# Patient Record
Sex: Male | Born: 1975 | Race: White | Hispanic: No | Marital: Married
Health system: Southern US, Community
[De-identification: ages and names within clinical notes are randomized; demographics above are authoritative.]

---

## 2012-02-02 ENCOUNTER — Emergency Department: Payer: Self-pay | Admitting: Unknown Physician Specialty

## 2012-02-02 LAB — URINALYSIS, COMPLETE
Bacteria: NONE SEEN
Bilirubin,UR: NEGATIVE
Glucose,UR: NEGATIVE mg/dL (ref 0–75)
Ph: 5 (ref 4.5–8.0)
Squamous Epithelial: <1
WBC UR: NONE SEEN /HPF (ref 0–5)

## 2012-02-02 LAB — CK TOTAL AND CKMB (NOT AT ARMC)
CK, Total: 183 U/L (ref 35–232)
CK-MB: 0.8 ng/mL (ref 0.5–3.6)

## 2012-02-02 LAB — COMPREHENSIVE METABOLIC PANEL
Albumin: 3.9 g/dL (ref 3.4–5.0)
Alkaline Phosphatase: 158 U/L — ABNORMAL HIGH (ref 50–136)
BUN: 6 mg/dL — ABNORMAL LOW (ref 7–18)
Bilirubin,Total: 1.2 mg/dL — ABNORMAL HIGH (ref 0.2–1.0)
Chloride: 94 mmol/L — ABNORMAL LOW (ref 98–107)
Co2: 27 mmol/L (ref 21–32)
Creatinine: 0.61 mg/dL (ref 0.60–1.30)
EGFR (Non-African Amer.): >60
SGPT (ALT): 246 U/L — ABNORMAL HIGH
Sodium: 135 mmol/L — ABNORMAL LOW (ref 136–145)
Total Protein: 8 g/dL (ref 6.4–8.2)

## 2012-02-02 LAB — DRUG SCREEN, URINE
Amphetamines, Ur Screen: NEGATIVE (ref ?–1000)
Benzodiazepine, Ur Scrn: NEGATIVE (ref ?–200)
Cannabinoid 50 Ng, Ur ~~LOC~~: NEGATIVE (ref ?–50)
Cocaine Metabolite,Ur ~~LOC~~: NEGATIVE (ref ?–300)
MDMA (Ecstasy)Ur Screen: NEGATIVE (ref ?–500)
Methadone, Ur Screen: NEGATIVE (ref ?–300)
Phencyclidine (PCP) Ur S: NEGATIVE (ref ?–25)
Tricyclic, Ur Screen: NEGATIVE (ref ?–1000)

## 2012-02-02 LAB — CBC
HGB: 15.3 g/dL (ref 13.0–18.0)
MCV: 89 fL (ref 80–100)
Platelet: 117 10*3/uL — ABNORMAL LOW (ref 150–440)
RBC: 4.88 10*6/uL (ref 4.40–5.90)
WBC: 5.6 10*3/uL (ref 3.8–10.6)

## 2012-02-02 LAB — ETHANOL
Ethanol %: 0.021 % (ref 0.000–0.080)
Ethanol: 21 mg/dL

## 2012-02-02 LAB — MAGNESIUM: Magnesium: 1.6 mg/dL — ABNORMAL LOW

## 2012-02-02 LAB — TROPONIN I: Troponin-I: <0.02 ng/mL

## 2012-05-12 ENCOUNTER — Emergency Department: Payer: Self-pay | Admitting: Emergency Medicine

## 2012-05-12 LAB — COMPREHENSIVE METABOLIC PANEL
Albumin: 4 g/dL (ref 3.4–5.0)
Alkaline Phosphatase: 125 U/L (ref 50–136)
Anion Gap: 17 — ABNORMAL HIGH (ref 7–16)
BUN: 7 mg/dL (ref 7–18)
Bilirubin,Total: 0.8 mg/dL (ref 0.2–1.0)
Calcium, Total: 7.7 mg/dL — ABNORMAL LOW (ref 8.5–10.1)
Chloride: 92 mmol/L — ABNORMAL LOW (ref 98–107)
Creatinine: 0.62 mg/dL (ref 0.60–1.30)
EGFR (Non-African Amer.): >60
Glucose: 116 mg/dL — ABNORMAL HIGH (ref 65–99)
Osmolality: 254 (ref 275–301)
Sodium: 127 mmol/L — ABNORMAL LOW (ref 136–145)
Total Protein: 8.1 g/dL (ref 6.4–8.2)

## 2012-05-12 LAB — BASIC METABOLIC PANEL
Anion Gap: 14 (ref 7–16)
Calcium, Total: 7.4 mg/dL — ABNORMAL LOW (ref 8.5–10.1)
Chloride: 103 mmol/L (ref 98–107)
Co2: 21 mmol/L (ref 21–32)
EGFR (African American): >60
EGFR (Non-African Amer.): >60
Potassium: 3.5 mmol/L (ref 3.5–5.1)

## 2012-05-12 LAB — ETHANOL
Ethanol %: 0.206 % — ABNORMAL HIGH (ref 0.000–0.080)
Ethanol: 206 mg/dL

## 2012-05-12 LAB — DRUG SCREEN, URINE
Amphetamines, Ur Screen: NEGATIVE (ref ?–1000)
Barbiturates, Ur Screen: NEGATIVE (ref ?–200)
Cannabinoid 50 Ng, Ur ~~LOC~~: NEGATIVE (ref ?–50)
Cocaine Metabolite,Ur ~~LOC~~: NEGATIVE (ref ?–300)
MDMA (Ecstasy)Ur Screen: NEGATIVE (ref ?–500)
Methadone, Ur Screen: NEGATIVE (ref ?–300)
Phencyclidine (PCP) Ur S: NEGATIVE (ref ?–25)

## 2012-05-12 LAB — CBC
HCT: 48.4 % (ref 40.0–52.0)
HGB: 17.1 g/dL (ref 13.0–18.0)
Platelet: 152 10*3/uL (ref 150–440)
RBC: 5.07 10*6/uL (ref 4.40–5.90)
WBC: 6.9 10*3/uL (ref 3.8–10.6)

## 2012-05-12 LAB — TSH: Thyroid Stimulating Horm: 1.88 u[IU]/mL

## 2012-05-12 LAB — ACETAMINOPHEN LEVEL: Acetaminophen: <2 ug/mL

## 2012-05-12 LAB — SALICYLATE LEVEL: Salicylates, Serum: 2.2 mg/dL

## 2012-05-16 ENCOUNTER — Ambulatory Visit: Payer: Self-pay | Admitting: Internal Medicine

## 2012-09-09 LAB — URINALYSIS, COMPLETE
Bilirubin,UR: NEGATIVE
Blood: NEGATIVE
Ketone: NEGATIVE
Leukocyte Esterase: NEGATIVE
Ph: 5 (ref 4.5–8.0)
Protein: NEGATIVE
RBC,UR: NONE SEEN /HPF (ref 0–5)
Squamous Epithelial: <1

## 2012-09-09 LAB — DRUG SCREEN, URINE
Cocaine Metabolite,Ur ~~LOC~~: NEGATIVE (ref ?–300)
MDMA (Ecstasy)Ur Screen: NEGATIVE (ref ?–500)
Methadone, Ur Screen: NEGATIVE (ref ?–300)
Opiate, Ur Screen: NEGATIVE (ref ?–300)
Tricyclic, Ur Screen: NEGATIVE (ref ?–1000)

## 2012-09-09 LAB — COMPREHENSIVE METABOLIC PANEL
Albumin: 4.1 g/dL (ref 3.4–5.0)
Anion Gap: 20 — ABNORMAL HIGH (ref 7–16)
Bilirubin,Total: 1.9 mg/dL — ABNORMAL HIGH (ref 0.2–1.0)
Calcium, Total: 8.1 mg/dL — ABNORMAL LOW (ref 8.5–10.1)
Chloride: 91 mmol/L — ABNORMAL LOW (ref 98–107)
Creatinine: 0.6 mg/dL (ref 0.60–1.30)
EGFR (African American): >60
Glucose: 139 mg/dL — ABNORMAL HIGH (ref 65–99)
Potassium: 3.7 mmol/L (ref 3.5–5.1)
SGOT(AST): 1766 U/L — ABNORMAL HIGH (ref 15–37)
SGPT (ALT): 1330 U/L — ABNORMAL HIGH (ref 12–78)

## 2012-09-09 LAB — CBC
HCT: 46.1 % (ref 40.0–52.0)
MCH: 32.2 pg (ref 26.0–34.0)
MCHC: 36 g/dL (ref 32.0–36.0)
MCV: 89 fL (ref 80–100)

## 2012-09-09 LAB — TSH: Thyroid Stimulating Horm: 3.49 u[IU]/mL

## 2012-09-09 LAB — ETHANOL: Ethanol %: 0.285 % — ABNORMAL HIGH (ref 0.000–0.080)

## 2012-09-10 ENCOUNTER — Inpatient Hospital Stay: Payer: Self-pay | Admitting: Internal Medicine

## 2012-09-10 LAB — HEPATIC FUNCTION PANEL A (ARMC)
Albumin: 4.2 g/dL (ref 3.4–5.0)
Alkaline Phosphatase: 152 U/L — ABNORMAL HIGH (ref 50–136)
Bilirubin,Total: 1.9 mg/dL — ABNORMAL HIGH (ref 0.2–1.0)
SGOT(AST): 1841 U/L — ABNORMAL HIGH (ref 15–37)

## 2012-09-10 LAB — PROTIME-INR
INR: 1.6
Prothrombin Time: 19.8 secs — ABNORMAL HIGH (ref 11.5–14.7)

## 2012-09-10 LAB — BASIC METABOLIC PANEL
Anion Gap: 20 — ABNORMAL HIGH (ref 7–16)
BUN: 9 mg/dL (ref 7–18)
Calcium, Total: 8.4 mg/dL — ABNORMAL LOW (ref 8.5–10.1)
Chloride: 95 mmol/L — ABNORMAL LOW (ref 98–107)
Co2: 20 mmol/L — ABNORMAL LOW (ref 21–32)
EGFR (African American): >60
Glucose: 186 mg/dL — ABNORMAL HIGH (ref 65–99)
Osmolality: 274 (ref 275–301)
Sodium: 135 mmol/L — ABNORMAL LOW (ref 136–145)

## 2012-09-10 LAB — MAGNESIUM: Magnesium: 1.7 mg/dL — ABNORMAL LOW

## 2012-09-10 LAB — SALICYLATE LEVEL: Salicylates, Serum: <1.7 mg/dL

## 2012-09-10 LAB — HEMOGLOBIN A1C: Hemoglobin A1C: 5.1 % (ref 4.2–6.3)

## 2012-09-10 LAB — OSMOLALITY: Osmolality: 346 mOsm/kg (ref 275–295)

## 2012-09-10 LAB — LIPASE, BLOOD: Lipase: 144 U/L (ref 73–393)

## 2012-09-11 LAB — COMPREHENSIVE METABOLIC PANEL
Alkaline Phosphatase: 118 U/L (ref 50–136)
Anion Gap: 10 (ref 7–16)
BUN: 7 mg/dL (ref 7–18)
Bilirubin,Total: 2.2 mg/dL — ABNORMAL HIGH (ref 0.2–1.0)
Calcium, Total: 7.8 mg/dL — ABNORMAL LOW (ref 8.5–10.1)
Chloride: 101 mmol/L (ref 98–107)
Co2: 26 mmol/L (ref 21–32)
Creatinine: 0.61 mg/dL (ref 0.60–1.30)
EGFR (African American): >60
EGFR (Non-African Amer.): >60
Osmolality: 272 (ref 275–301)
Potassium: 3.9 mmol/L (ref 3.5–5.1)
SGPT (ALT): 940 U/L — ABNORMAL HIGH (ref 12–78)

## 2012-09-11 LAB — CBC WITH DIFFERENTIAL/PLATELET
Basophil %: 0.8 %
Eosinophil #: 0.1 10*3/uL (ref 0.0–0.7)
HCT: 40.8 % (ref 40.0–52.0)
HGB: 14.1 g/dL (ref 13.0–18.0)
Lymphocyte %: 41 %
MCHC: 34.6 g/dL (ref 32.0–36.0)
MCV: 91 fL (ref 80–100)
Monocyte %: 7.4 %
Neutrophil #: 2.5 10*3/uL (ref 1.4–6.5)
Neutrophil %: 48.6 %
RDW: 15.1 % — ABNORMAL HIGH (ref 11.5–14.5)

## 2012-09-12 LAB — HEPATIC FUNCTION PANEL A (ARMC)
Albumin: 3.6 g/dL (ref 3.4–5.0)
Alkaline Phosphatase: 121 U/L (ref 50–136)
Bilirubin, Direct: 0.5 mg/dL — ABNORMAL HIGH (ref 0.00–0.20)
Bilirubin,Total: 1.8 mg/dL — ABNORMAL HIGH (ref 0.2–1.0)
SGOT(AST): 482 U/L — ABNORMAL HIGH (ref 15–37)
SGPT (ALT): 698 U/L — ABNORMAL HIGH (ref 12–78)

## 2012-09-16 ENCOUNTER — Ambulatory Visit: Payer: Self-pay | Admitting: Unknown Physician Specialty

## 2012-09-23 ENCOUNTER — Ambulatory Visit: Payer: Self-pay | Admitting: Unknown Physician Specialty

## 2012-09-26 IMAGING — CT CT CHEST W/ CM
1 series · 1 of 1 positions shown, 3 images · non-contrast
Comparison: none

REASON FOR EXAM: cp sob htn crisis r/o dissection
COMMENTS:   May transport without cardiac monitor

[Series 1005: ct_routine_abdomen_genericreadingtask result image · 0.80mm/px · 1 of 1 slices shown, 3 images]
[im 1/1  soft-tissue]
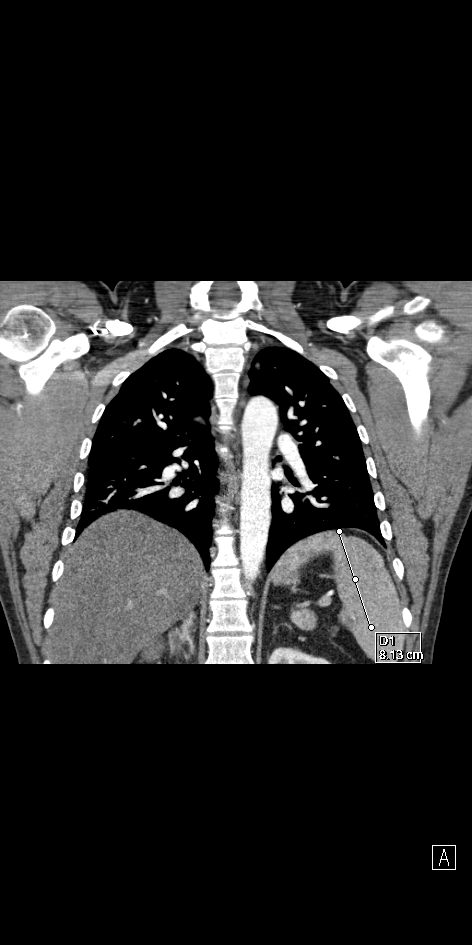
[im 1/1  lung]
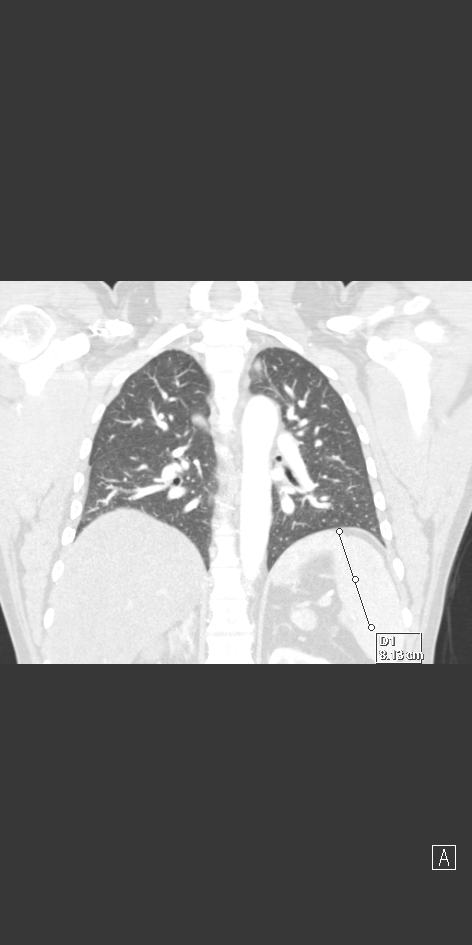
[im 1/1  bone]
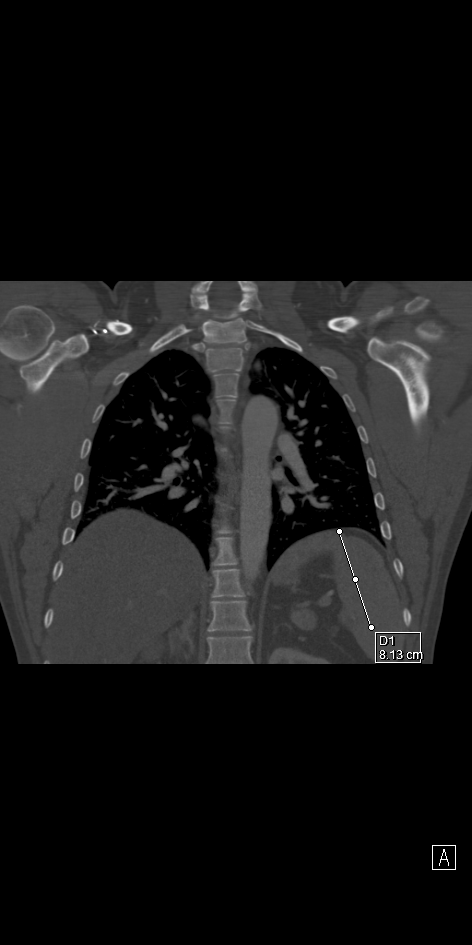

[1 of 1 positions shown; findings below may reference images not displayed]

PROCEDURE:     CT  - CT CHEST WITH CONTRAST  - February 02, 2012 [DATE]

RESULT:     CT scanning was performed through the chest with reconstructions
in the axial plane at 3 mm intervals and slice thicknesses following
intravenous administration of 100 cc of [id number]. Review of multiplanar
reconstructed images was performed separately on the VIA monitor.

The caliber of the thoracic aorta is normal. There is no evidence of a false
lumen. There is no periaortic hematoma. The cardiac chambers are normal in
size. Contrast within the visualized portions of the pulmonary arterial tree
is normal. I see no mediastinal nor hilar lymphadenopathy. There is no
pleural nor pericardial effusion.

At lung window settings I see no interstitial nor alveolar infiltrates. No
pulmonary parenchymal nodules are demonstrated. The thoracic vertebral
bodies are preserved in height. Within the upper abdomen the liver
demonstrates decreased density diffusely consistent with fatty infiltration.
The spleen is mildly enlarged measuring 14 cm in oblique AP dimension. There
is accessory spleen measuring 1.5 cm in diameter. The adrenal glands appear
normal where visualized but are not completely included in the field-of-view.
IMPRESSION: 1. There is no evidence of thoracic aortic dissection.
2. I see no acute pulmonary embolism. There is no evidence of CHF.
3. There is no evidence of pneumonia nor pneumothorax nor pneumomediastinum.
4. There are fatty infiltrative changes diffusely throughout the liver.

## 2012-10-24 ENCOUNTER — Ambulatory Visit: Payer: Self-pay | Admitting: Unknown Physician Specialty

## 2013-05-05 IMAGING — US ABDOMEN ULTRASOUND
1 series · 14 of 25 positions shown · non-contrast
Comparison: none

REASON FOR EXAM: alcoholic hepatitis
COMMENTS:

PROCEDURE:     US  - US ABDOMEN GENERAL SURVEY  - September 10, 2012 [DATE]
RESULT:     Comparison: None.
TECHNIQUE: Multiple grayscale and color Doppler images were obtained of the
abdomen.

[Series 1: abdomen ultrasound · 0.31mm/px · 14 of 70 slices shown]
[im 1/70]
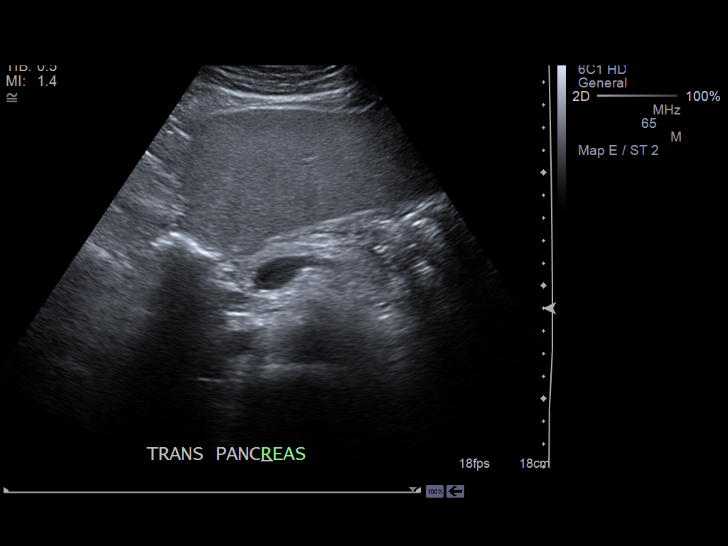
[im 6/70]
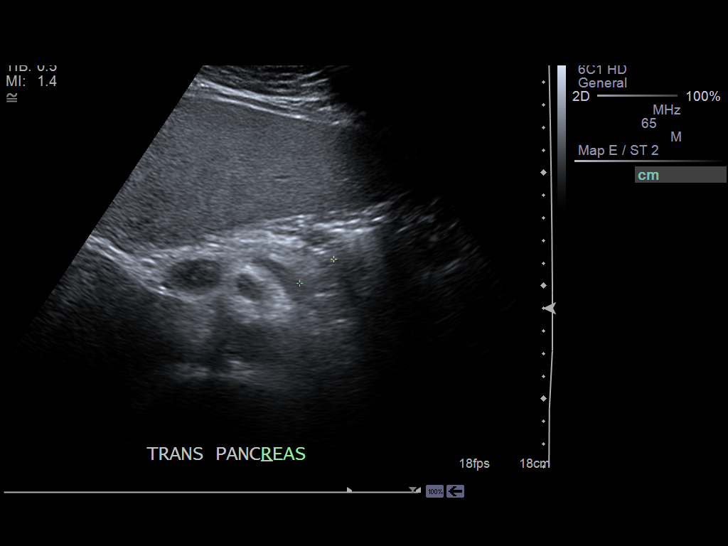
[im 12/70]
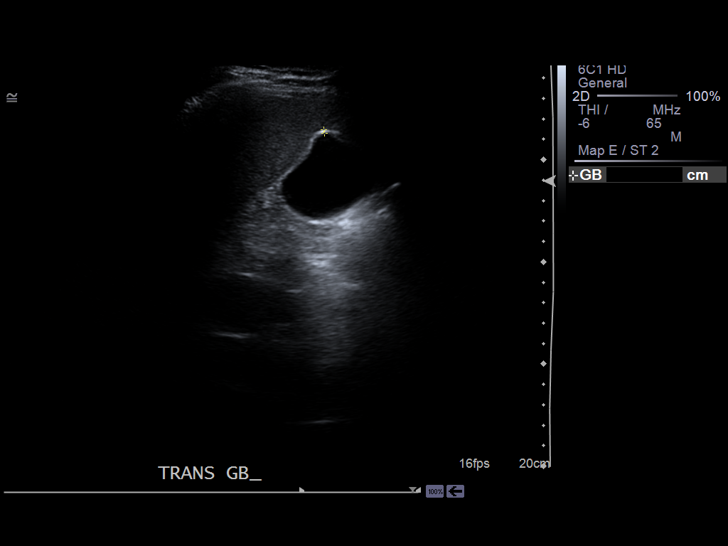
[im 18/70]
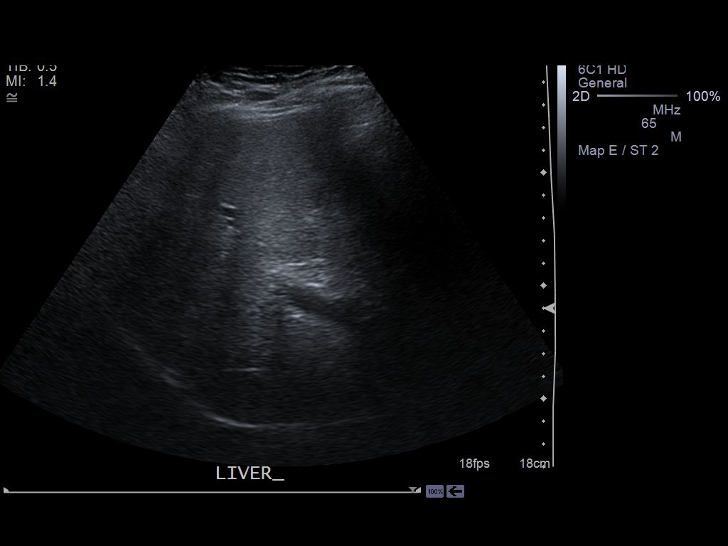
[im 24/70]
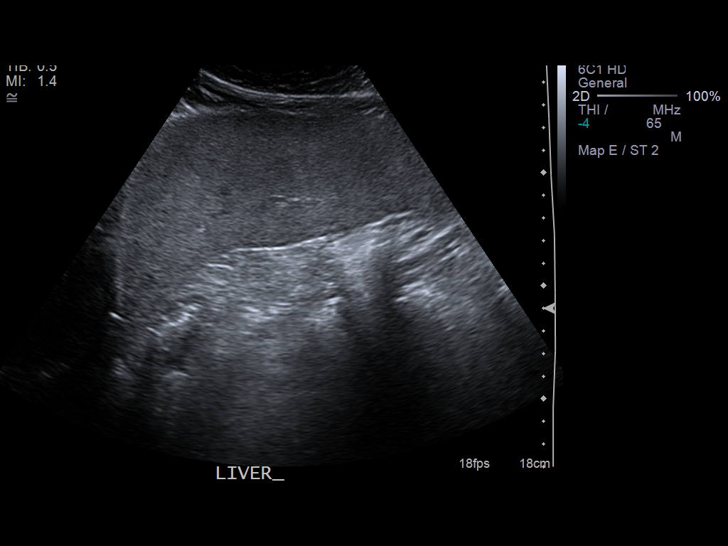
[im 26/70]
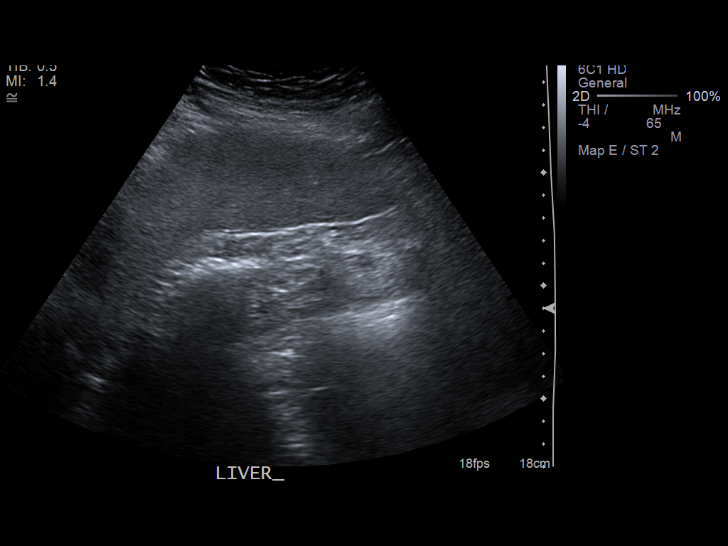
[im 32/70]
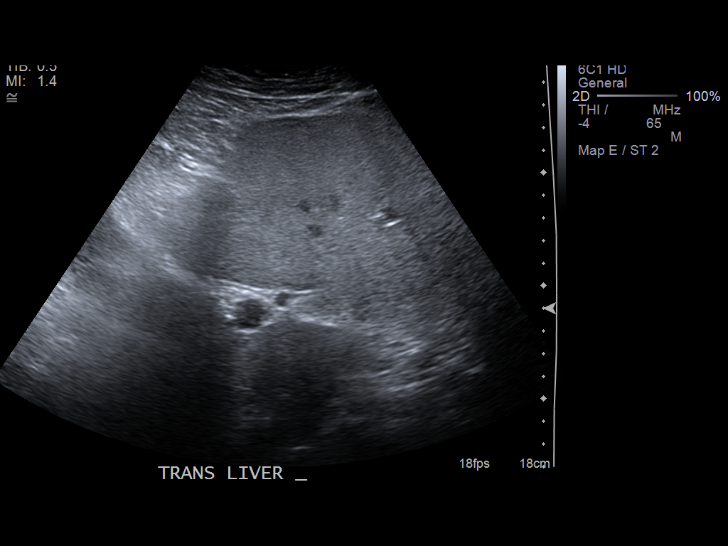
[im 38/70]
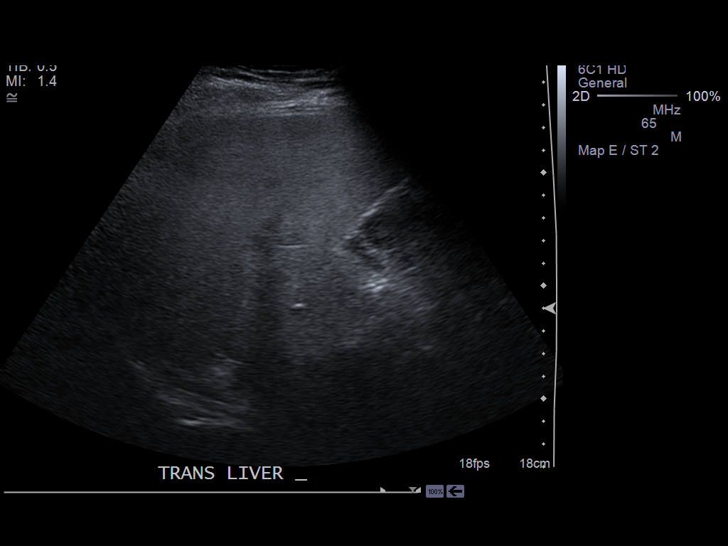
[im 44/70]
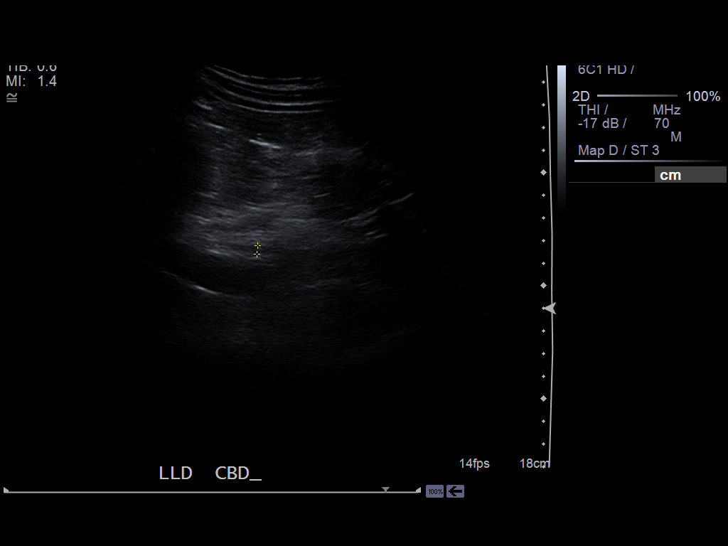
[im 47/70]
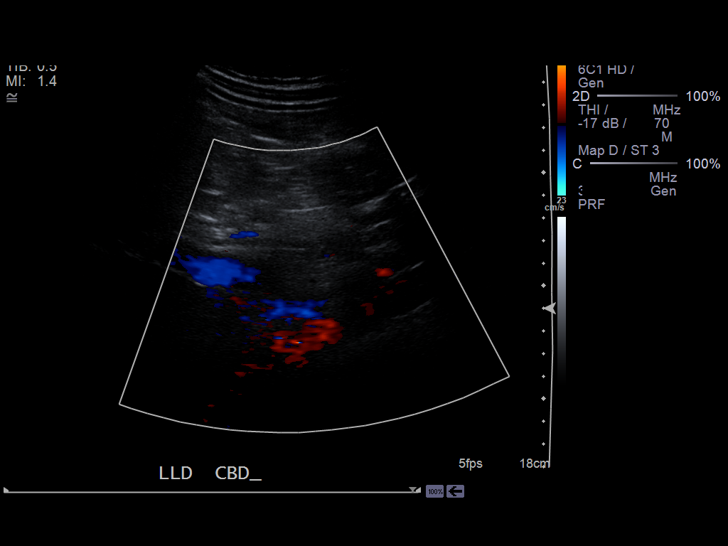
[im 52/70]
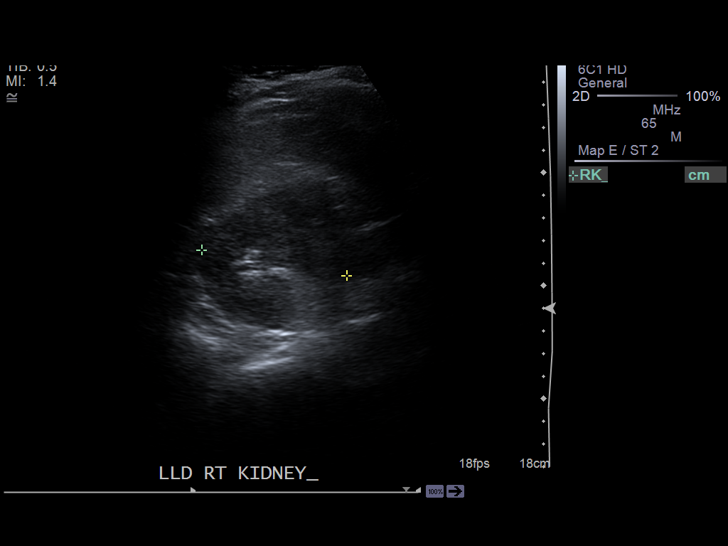
[im 58/70]
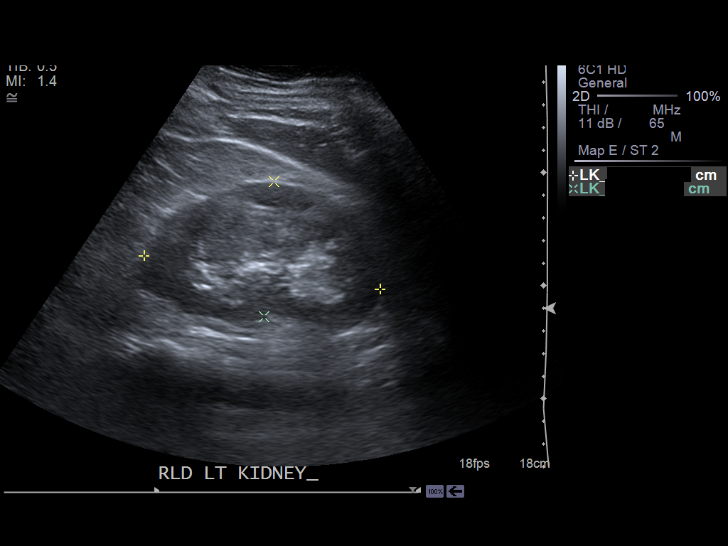
[im 64/70]
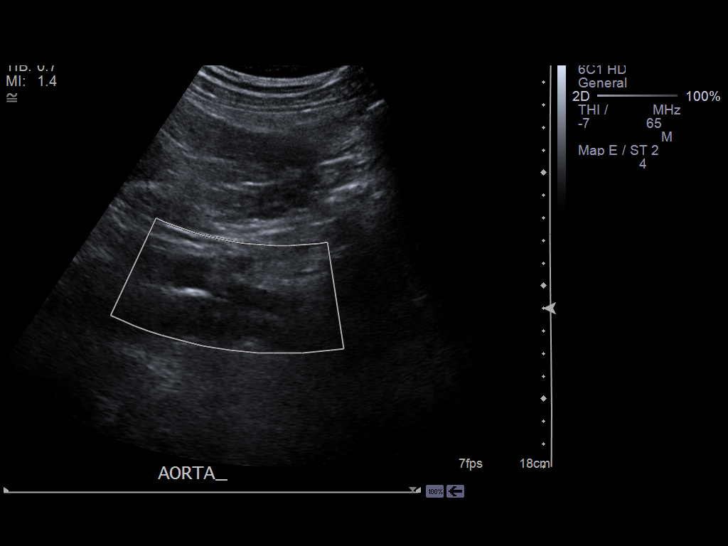
[im 70/70]
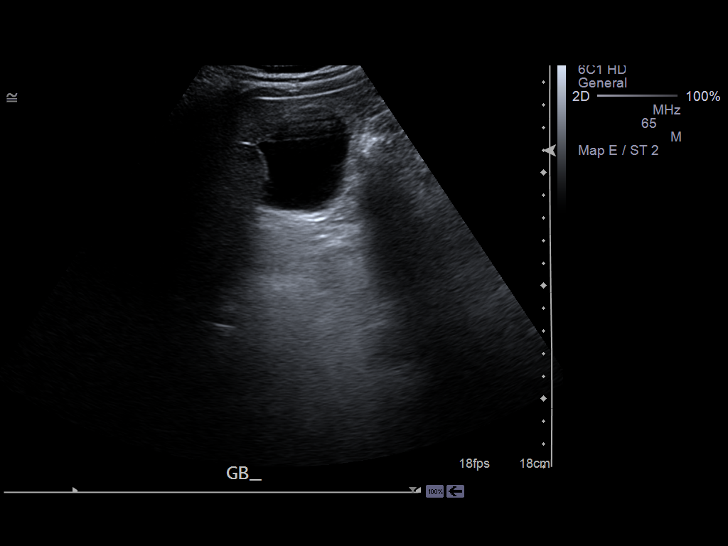

[14 of 25 positions shown; findings below may reference images not displayed]

FINDINGS: The left hepatic lobe is somewhat prominent in size. The liver is somewhat
heterogeneous in echotexture. The portal vein demonstrates normal
directional flow. The visualized pancreas and spleen are unremarkable. No
gallstones are seen. There are several folds in the gallbladder. The
sonographic Murphy sign was negative. The common bile duct measures 4 mm.

Image of the kidneys showed no hydronephrosis.
IMPRESSION: No acute findings. The liver is somewhat heterogeneous in echotexture and
the left hepatic lobe is somewhat prominent in size. This is nonspecific.

[REDACTED]

## 2015-04-12 NOTE — Consult Note (Signed)
PATIENT NAME:  [name] BertinWOODY, [name] E MR#:  [id number] DATE OF BIRTH:  [birth year]  DATE OF CONSULTATION:  09/10/2012  REFERRING PHYSICIAN:  [facility]  CONSULTING PHYSICIAN:  Lurline DelShaukat Shravya Wickwire, MD  REASON FOR CONSULTATION: Probable alcoholic hepatitis.   HISTORY OF PRESENT ILLNESS: This is a 39 year old Caucasian male with history of chronic alcohol abuse who presented for detox. The patient was noted to have very abnormal liver enzymes. The liver enzymes are much more higher than in the past. Apparently he has been drinking very heavily within the last week or so and then decided to come to the hospital for detox. He denies any abdominal pain, denies any nausea or vomiting, denies any diarrhea or constipation, and denies using any Tylenol or any other illicit drugs.   PAST MEDICAL HISTORY:  1. 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  2. History of hypernatremia in the past. 3. Hypertension. 4. Depression. 5. Chronic alcohol abuse. 6. Fatty liver and mild splenomegaly with accessory spleen, according to the chart.   PAST SURGICAL HISTORY: None.   ALLERGIES: No known drug allergies.   HOME MEDICATIONS: Citalopram, omeprazole, Ativan, and metoprolol.   SOCIAL HISTORY: He drinks up to 24 drinks a day.   FAMILY HISTORY: Unremarkable.   REVIEW OF SYSTEMS: Grossly negative except for what is mentioned in the History of Present Illness.     PHYSICAL EXAMINATION:   GENERAL: Fairly well built male. He does appear to be somewhat shaky and tremulous.  VITAL SIGNS: Temperature 97.8, pulse 85, respirations 18 to 20, and blood pressure 144/90. Clinically he is not jaundiced.   NECK: Neck veins are flat.   LUNGS: Clear to auscultation bilaterally with fair air entry and no added sounds.   CARDIOVASCULAR: Regular rate and rhythm. No gallops or murmurs.   ABDOMEN: Abdominal examination is fairly benign. No significant hepatosplenomegaly was noted. No ascites.   EXTREMITIES: No edema.    NEUROLOGIC: Negative except for mild bilateral upper extremity tremors.   RESULTS: BUN and creatinine are normal. Serum sodium is low at 128. Total bilirubin is 1.9, alkaline phosphatase 137, ALT 1330, and AST 1766. Serum alcohol level is 285. TSH is normal. Drug screen is negative. Serum lipase is normal at 144. INR is slightly elevated at 1.6. White cell count is 10.7, hemoglobin 16, and platelet count 259.  Ultrasound of the abdomen showed no acute finding, somewhat heterogenous echotexture of the liver with prominent left hepatic lobe.   Serum Tylenol and salicylate levels are normal.   ASSESSMENT AND PLAN: The patient is with what appears to be acute alcoholic hepatitis. Hepatic function seems to be fairly well preserved, although he does have mild hyperbilirubinemia and mild coagulopathy. I will go ahead and obtain an acute hepatitis panel to rule out other causes of acute hepatitis. Repeat LFTs every day, repeat PT and INR on a daily basis. Currently the patient is not showing any signs of encephalopathy. In case there are signs of encephalopathy, I would recommend checking his ammonia and starting him on lactulose. The     patient is at high risk of going into DTs in which case I would recommend minimizing the use of Ativan and other sedatives, although they can certainly be used if necessary. We will follow and make further recommendations.  ____________________________ Lurline DelShaukat Virgie Chery, MD si:slb D: 09/10/2012 19:28:15 ET T: 09/11/2012 12:31:31 ET JOB#: [id number]  cc: Lurline DelShaukat Jalena Vanderlinden, MD, <Dictator> Lurline DelSHAUKAT Gema Ringold MD ELECTRONICALLY SIGNED 09/15/2012 12:36

## 2015-04-12 NOTE — Discharge Summary (Signed)
PATIENT NAME:  [name] Porter, [name] MR#:  [id number] DATE OF BIRTH:  1976/03/21  DATE OF ADMISSION:  09/10/2012 DATE OF DISCHARGE:  09/12/2012  ADMITTING DIAGNOSES: Nausea and vomiting.  DISCHARGE DIAGNOSES:  1. Nausea and vomiting due to acute alcoholic hepatitis, now his hepatitis is improving. 2. Acute alcoholic hepatitis, LFTs trending downwards. The patient was seen by gastroenterology and initially started on prednisone due to discrimination function of 38. He also had a hepatitis panel which was negative. The patient is recommended to stop drinking.  3. Alcohol abuse. He was seen by psychiatry. He is offered a program for outpatient therapy for detox. The patient is deciding on that.  4. Anion gap metabolic acidosis, resolved with IV hydration.  5. Mild alcohol withdrawal, improved with current treatment. The patient was offered benzodiazepines at discharge. He states that he has 1-1/2 weeks worth from previous hospitalization.  6. Hyponatremia felt to be due to dehydration, possibly beer potomania, resolved with IV fluids.  7. Hypomagnesemia, replaced. 8. History of reflux.  9. Hypertension with accelerated blood pressure intermittently during hospitalization related to withdrawal.  10. History of depression and anxiety.  CONSULTANTS DURING HOSPITALIZATION:  1. Lurline DelShaukat Iftikhar, MD 2. Greer EeWilliam Ryan, MD  PERTINENT LABS AND EVALUATIONS: BMP: Glucose 139, BUN 8, creatinine 0.60, sodium 128, potassium 3.7, chloride 91, CO2 17, and calcium 8.1. Magnesium 1.7 and on 09/10/2012 magnesium was 2.2. Sodium 137. LFTs on presentation: Total protein 8.1, albumin 4.1, bilirubin total 1.9, alkaline phosphatase 137, AST 1766, and ALT 1330. Subsequent LFTs on 09/12/2012 showed a bilirubin total of 1.8. Today AST is 482 and ALT 698. TSH was 3.49. TUDs were negative. INR 1.3. Hepatitis A, B, and C were negative. Acetaminophen level less than 2.    Ultrasound of the abdomen showed no acute findings. Liver was  somewhat heterogeneous in echotexture. Left hepatic lobe was somewhat prominent in size.   HOSPITAL COURSE: Please refer to the history and physical done by the admitting physician. The patient is a 39 year old Caucasian male with history of chronic alcohol abuse who presented with the chief complaint of wanting detox stating he wanted to get help because he wanted to stop drinking. The patient prior to arriving was drinking heavily for the past 1 to 2 weeks, drinking about 12 drinks daily. He started having also nausea and vomiting. He came to the ED and was noted to have acute alcoholic hepatitis.  He also had anion gap metabolic acidosis as well as multiple other metabolic derangements. The patient was admitted and was started on IV fluids. He was also started on prednisone due to his LFTs being significantly elevated. He was seen in consultation by gastroenterology as well. As the hospitalization progressed, his LFTs started trending downwards and if the patient does not drink as expected then his LFTs will return back close to his baseline. He also was seen by psychiatry. They recommended CD IOP after discharge. The patient is to decide on this after discussion with his wife. The patient was strongly discouraged to drink. He was also offered benzos for withdrawal. He reports that he has Ativan from previously that he was prescribed. At this time, he is stable and stable for discharge.   DISCHARGE MEDICATIONS:  1. Metoprolol extended-release 25 mg daily.  2. Omeprazole 40 mg daily.  3. Citalopram 40 mg daily.  4. Folic acid 1 mg daily.  5. Multivitamin p.o. daily.   DIET: Regular.   ACTIVITY: As tolerated.  DISCHARGE FOLLOWUP: Followup with [facility] in 1 to 2 weeks. Followup with CD IOP as per psychiatry.  TIME SPENT: 32 minutes. ____________________________ [name] Porter [name] Katz, MD shp:slb D: 09/12/2012 14:16:56 ET T: 09/15/2012 12:06:47 ET JOB#: [id number]  cc: Lania Zawistowski  H. [name] Katz, MD, <Dictator> [facility] [place] Charise Carwin MD ELECTRONICALLY SIGNED 09/16/2012 15:00

## 2015-04-12 NOTE — H&P (Signed)
PATIENT NAME:  Porter Porter MR#:  [id number] DATE OF BIRTH:  January 13, 1976  DATE OF ADMISSION:  09/10/2012  PRIMARY CARE PHYSICIAN:  [facility], [place]. He says that he has been there recently and he was seen by Doristine Mango.   CHIEF COMPLAINT: Alcohol detox, nausea and vomiting.   HISTORY OF PRESENT ILLNESS: The patient is a 39 year old Caucasian male with history of chronic alcohol abuse presented with chief complaint of wanting detox stating, "I needed to get help because of changes I saw in myself". Upon probing he reports that he has been drinking heavily over the last 1 to 2 weeks, drinking about 12 drinks daily and two days ago developed nausea, vomiting and just overall feeling sick and decided that he needed to get help. He denies abdominal pain, fevers or chills, flulike symptoms or lymphadenopathy. He does note some fatigue.   The patient notes that has a history of chronic alcohol abuse and required detox back in May. At that time he was drinking about 24 drinks per day, if not more, daily. He was admitted for alcohol detox at that time and he said that since May he has relapsed about 3 times with intermittent, basically monthly binges of two weeks of heavy drinking. He notes that he was having about a drink per day, but then two weeks ago increased his quantity to about 12 drinks per day and when asked what life event prompted this behavior, he just says that sometimes he remembers that his sister overdosed from drugs "six months ago" and passed away in [facility]. Of note, he said the exact same thing when he was evaluated here back in May 2013.   He denies history of alcohol withdrawal seizures, but notes that when he was "drinking heavily back in May" and after stopping alcohol, within 1 to 2 days he would get shakes.   Upon evaluation in the Emergency Department he was noted to have severe hepatitis with AST of 1,766 and ALT 1,330 which is markedly increased from prior  laboratory data that has been available. On his last evaluation in May he had transaminitis, AST was 272, ALT 145 and in February his AST was 415 and his ALT was 246.   The patient denies taking Tylenol, aspirin or drinking other non-ethanol products like antifreeze, etc.   PAST MEDICAL HISTORY:  1. Hepatitis, most likely alcoholic hepatitis.  2. Transient hyponatremia noted on labs in the past.  3. Hypertension.  4. Depression, anxiety.  5. Chronic alcohol abuse.  6. Fatty liver.  7. Mild splenomegaly with accessory spleen.   PAST SURGICAL HISTORY: None.   ALLERGIES: No known drug allergies, admits to seasonal allergies.   MEDICATIONS:  1. Metoprolol 25 mg extended release 1 tablet daily. It looks like last fill date was in May.  2. Citalopram 10 mg daily.  3. Omeprazole 40 mg daily.  4. Ativan (He notes this was given to him the last time he was going through detox. Review of our records notes that he was on Ativan 1 mg twice a day. The patient states the last dose of Ativan was four days ago).   SOCIAL HISTORY: Daily alcohol intake, currently 12 drinks per day and has had up to 24 drinks or greater per day. He denies tobacco. He quit 15 years ago. Denies illicits. He is married with two children, a 46 year old daughter and a 6-year-son. He is currently unemployed.   FAMILY HISTORY: Family with alcohol abuse, prostate cancer,  hypertension. Paternal grandmother had lung cancer. She was a smoker. Paternal uncles have prostate and lung cancer. Mother's side is notable for heart disease. Mother herself has history of depression, reflux and unknown heart disease. Has one sister that overdosed on pills and is deceased. Has one younger sister that as far as he knows is healthy.   REVIEW OF SYSTEMS: CONSTITUTIONAL: Denies chills, fever, weight loss, weight gain. Admits to fatigue. EYES: Denies blurred vision, double vision or eye pain. ENT: Denies tinnitus, ear pain or sore throat. RESPIRATORY:  Denies cough, hemoptysis, or dyspnea. CARDIOVASCULAR: Denies chest pain, orthopnea or edema. Admits to intermittent palpitations in the past. GASTROINTESTINAL: Admits to nausea, vomiting. Vomiting developed two days ago and was beer. No diarrhea nor associated abdominal pain. No hematemesis. No melena. No rectal bleeding. GU: Denies dysuria. ENDOCRINE: Denies increased sweating. INTEGUMENTARY: Denies new rashes. MUSCULOSKELETAL: Denies pain in any joints or muscles. Denies joint swelling. NEUROLOGIC: Denies numbness or headache or seizures. Admits to some memory problems stating he has some difficulty remembering things.  PSYCH: Admits to anxiety and depression.   PHYSICAL EXAMINATION:  VITAL SIGNS: Temperature 98.1, heart rate 107, blood pressure 183/106, respirations 20, pulse 100% on room air.   GENERAL: Well-appearing Caucasian male in no apparent distress.   HEENT: Normocephalic, atraumatic. Extraocular muscles are intact. Pupils equal, round, and reactive to light and accommodation. Normal external ears and nares. Posterior oropharynx is clear without erythema or exudate. Sclerae anicteric. There is no sublingual jaundice.   SKIN: Warm and dry. No jaundice. No skin rashes noted. No stigmata of liver disease.   NECK: Supple. No thyromegaly.   CARDIOVASCULAR: Normal S1, S2, tachycardiac. No murmurs. No pretibial edema. Distal pulses are palpable.   PULMONARY: Clear to auscultation bilaterally. Normal effort.   ABDOMEN: Soft, nontender, nondistended. Mild hepatomegaly. No palpable splenomegaly.   PSYCHIATRIC: Awake, alert and oriented x3. Judgment and insight intact.   LYMPHATIC: No palpable cervical or inguinal lymphadenopathy.   LABORATORY DATA: WBC count is 10.7, hemoglobin 16.6, hematocrit 46.1, platelets 259. MCV is 89. Glucose 139, BUN 8, creatinine 0.6, sodium 128, potassium 3.7, chloride 91, bicarbonate 17, calcium 8.1 bilirubin 1.9, alkaline phosphatase 137, ALT 1,330, AST 1,766,  total protein 8.1, albumin 4.1, osmolality 258, anion gap 20. Alcohol level 285. TSH is 3.49. Urinalysis is negative. UDS is negative. INR 1.6. Magnesium is 1.73. Prothrombin 19.8.   ASSESSMENT AND PLAN: A 39 year old male with history of fatty liver disease, chronic alcohol abuse, likely chronic alcoholic hepatitis presenting with acute alcoholic hepatitis with significant transaminitis without markers of end-stage liver disease.  1. Acute alcoholic hepatitis. The patient has a discriminate function of 38, so we will start the patient on corticosteroids. We will place a gastroenterology consult. We will trend his liver function. If his liver function tests continue to deteriorate, will likely transfer to a liver tertiary center. We will check salicylates, acetaminophen levels, hepatitis panel. We will check an abdominal ultrasound to further assess his anatomy. We will check LFTs daily. Monitor for encephalopathy. Check lipase. 2. Anion gap metabolic acidosis. This may be as a result of poor lactate clearance given his acute hepatitis, acute liver failure. We will check lactate level and check an ABG to quantify this anion gap metabolic acidosis. Given his alcohol abuse, also concerned for a possible ingestion of other substances, so we will check serum osmolality. We will calculate an osmolar gap to determine ingestion of other substances. We will start IV fluids and follow basic metabolic panel and  acidosis.  3. Hyponatremia, most likely as a result of beer potomania. He has had a history of this in the past. We will hydrate the patient and reassess.  4. Alcohol abuse. We will place the patient on CIWA protocol and consult psychiatry.  5. Hypomagnesemia. This is most likely secondary to alcohol. Will replete.  6. History of reflux. Will continue his omeprazole daily.  7. Hypertension. We will continue metoprolol.  8. History of depression and anxiety. Will continue citalopram.  9. Coaguloapthy. Due to  liver disease. Monitor. 10. Prophylaxis. Lovenox.   DISPOSITION: The patient is being admitted to the Critical Care Unit given his acute severe hepatitis at least for 24 hours.   CODE STATUS: FULL CODE. Surrogate decision maker is his wife, Rayn Enderson.   TIME SPENT: 60 minutes.   ____________________________ Aurther Loft, DO aeo:ap D: 09/10/2012 02:05:29 ET T: 09/10/2012 07:19:25 ET JOB#: [id number]  cc: Aurther Loft, DO, <Dictator> [facility] [place] Jaquitta Dupriest E Goerge Mohr DO ELECTRONICALLY SIGNED 09/28/2012 0:27

## 2015-04-12 NOTE — Consult Note (Signed)
Chief Complaint:   Subjective/Chief Complaint Covering for Dr. Dionne Milo. Looks well. Eating without problems. Shaking improving.LFT improving.   VITAL SIGNS/ANCILLARY NOTES: **Vital Signs.:   20-Sep-13 08:42   Vital Signs Type Routine   Temperature Temperature (F) 97.9   Celsius 36.6   Temperature Source oral   Pulse Pulse 78   Respirations Respirations 20   Systolic BP Systolic BP 876   Diastolic BP (mmHg) Diastolic BP (mmHg) 94   Mean BP 109   BP Source  if not from Vital Sign Device non-invasive   Pulse Ox % Pulse Ox % 98   Pulse Ox Activity Level  At rest   Oxygen Delivery Room Air/ 21 %   Brief Assessment:   Cardiac Regular    Respiratory clear BS    Gastrointestinal Normal   Lab Results: Hepatic:  19-Sep-13 05:17    Bilirubin, Total  2.2   Alkaline Phosphatase 118   SGPT (ALT)  940   SGOT (AST)  982   Total Protein, Serum 7.0   Albumin, Serum 3.5  Routine Chem:  19-Sep-13 05:17    Result Comment POTASSIUM/AST/MG - Slight hemolysis, interpret results with  - caution.  Result(s) reported on 11 Sep 2012 at 07:05AM.   Glucose, Serum  104   BUN 7   Creatinine (comp) 0.61   Sodium, Serum 137   Potassium, Serum 3.9   Chloride, Serum 101   CO2, Serum 26   Calcium (Total), Serum  7.8   Osmolality (calc) 272   eGFR (African American) >60   eGFR (Non-African American) >60 (eGFR values <45m/min/1.73 m2 may be an indication of chronic kidney disease (CKD). Calculated eGFR is useful in patients with stable renal function. The eGFR calculation will not be reliable in acutely ill patients when serum creatinine is changing rapidly. It is not useful in  patients on dialysis. The eGFR calculation may not be applicable to patients at the low and high extremes of body sizes, pregnant women, and vegetarians.)   Anion Gap 10   Magnesium, Serum 2.2 (1.8-2.4 THERAPEUTIC RANGE: 4-7 mg/dL TOXIC: > 10 mg/dL  -----------------------)  20-Sep-13 04:52    Result Comment  AST/DBIL - Slight hemolysis, interpret results with  - caution.  - TPL  Result(s) reported on 12 Sep 2012 at 06:00AM.  Routine Coag:  19-Sep-13 05:17    Prothrombin  16.3   INR 1.3 (INR reference interval applies to patients on anticoagulant therapy. A single INR therapeutic range for coumarins is not optimal for all indications; however, the suggested range for most indications is 2.0 - 3.0. Exceptions to the INR Reference Range may include: Prosthetic heart valves, acute myocardial infarction, prevention of myocardial infarction, and combinations of aspirin and anticoagulant. The need for a higher or lower target INR must be assessed individually. Reference: The Pharmacology and Management of the Vitamin K  antagonists: the seventh ACCP Conference on Antithrombotic and Thrombolytic Therapy. COTLXB.2620Sept:126 (3suppl): [id number] A HCT value >55% may artifactually increase the PT.  In one study,  the increase was an average of 25%. Reference:  "Effect on Routine and Special Coagulation Testing Values of Citrate Anticoagulant Adjustment in Patients with High HCT Values." American Journal of Clinical Pathology 2006;126:400-405.)  Routine Hem:  19-Sep-13 05:17    WBC (CBC) 5.2   RBC (CBC) 4.50   Hemoglobin (CBC) 14.1   Hematocrit (CBC) 40.8   Platelet Count (CBC)  145   MCV 91   MCH 31.4   MCHC 34.6   RDW  15.1   Neutrophil % 48.6   Lymphocyte % 41.0   Monocyte % 7.4   Eosinophil % 2.2   Basophil % 0.8   Neutrophil # 2.5   Lymphocyte # 2.1   Monocyte # 0.4   Eosinophil # 0.1   Basophil # 0.0 (Result(s) reported on 11 Sep 2012 at 07:05AM.)   Assessment/Plan:  Assessment/Plan:   Assessment Alcoholic hepatitis. LFT improving.    Plan Prob ok for discharge. May need to continue DT prophylaxis. Pt knows to stop drinking alcohol. Will sign off. Thanks.   Electronic Signatures: Verdie Shire (MD)  (Signed 20-Sep-13 13:10)  Authored: Chief Complaint, VITAL SIGNS/ANCILLARY  NOTES, Brief Assessment, Lab Results, Assessment/Plan   Last Updated: 20-Sep-13 13:10 by Verdie Shire (MD)

## 2015-04-12 NOTE — Consult Note (Signed)
Consult: treatment recommendations Patient was seen at the request of Dr.Ryan in order to explore Outpatient Rehab for patient's abuse of alcoholic beverage. He was interviewed and oriented to the CD-IOP and is an excellent candidate for treatment. Patient has agreed to participate in the CD-IOP after Discharge from this level of care. Assigned Nurse informed of treatment recommendations.   Electronic Signatures: Huel Cotehomas, Richard (PsyD)  (Signed on 19-Sep-13 23:58)  Authored  Last Updated: 19-Sep-13 23:58 by Huel Cotehomas, Richard (PsyD)

## 2015-04-12 NOTE — Consult Note (Signed)
Brief Consult Note: Diagnosis: Probable ETOH hepatitis.   Patient was seen by consultant.   Consult note dictated.   Comments: Probable ETOH hyepatitis. No evidence of significant hepatic dysfunction.  Recommendations: Follow LFT's. Daily INR. Will follow.  Electronic Signatures: Lurline DelIftikhar, Celise Bazar (MD)  (Signed 18-Sep-13 19:23)  Authored: Brief Consult Note   Last Updated: 18-Sep-13 19:23 by Lurline DelIftikhar, [place] Pettengill (MD)

## 2015-04-12 NOTE — Consult Note (Signed)
PATIENT NAME:  [name] Porter, [name] Porter MR#:  [id number] DATE OF BIRTH:  1976/09/16  DATE OF CONSULTATION:  09/10/2012  REFERRING PHYSICIAN:  Dr. Marc Morgans  CONSULTING PHYSICIAN:  Venida Jarvis, MD  REASON FOR CONSULTATION: Alcohol dependence.   CHIEF COMPLAINT: Alcohol.   HISTORY OF PRESENT ILLNESS: Patient was admitted to first the Intensive Care Unit and then medicine following a binge drinking in which he probably had alcohol hepatitis as well as alcohol withdrawal. He first began drinking at the age of 39, first began to be a problem at the age of 6. He was last detoxed in May of this year and since that time has had binges of up to two weeks about once a month. More recently has been drinking for the last two weeks drinking 12 beers or 12 drinks per day, occasionally begins in the morning. Denies blackouts. Does get shaky not drinking. No violence. No history of DWIs. Last drink was about 18 hours ago. He presented with nausea, vomiting, diarrhea, and GI complaints probably secondary to the alcohol and alcoholic hepatitis. He has had no true alcohol treatment. Never been to AA.   Patient also reports some anxiety recently and some depression with symptoms including decreased sleep, decreased interest, decreased energy, decreased concentration, decreased appetite. No suicidal thoughts, no history of suicide attempts.   Currently he tells me he feels fairly comfortable.   FAMILY HISTORY: Sister died of an accidental overdose of narcotics. Father and numerous family members on his father's side were alcoholics.   PAST MEDICAL HISTORY:   1. Currently alcoholic hepatitis which he has had had before.  2. He has had transient hyponatremia in the past. 3. Hypertension. 4. History of fatty liver. 5. Mild splenomegaly with accessory spleen.   ALLERGIES: He has no known allergies.   MEDICATIONS ON ADMISSION:  1. Metoprolol 25 mg 1 daily.  2. Celexa 10 mg daily. Patient reports the Celexa may have  helped some. 3. Omeprazole 40 mg daily.  4. Ativan p.r.n. I think he has been taking it a little bit lately but not regularly.   REVIEW OF SYSTEMS: Negative except for the GI symptoms nausea, vomiting and diarrhea which are cleared at present.   SOCIAL HISTORY: He has two years of college. He has been unemployed for three months, formally a meat cutter. He has been married 11 years, has an older daughter 30 who he does see and a son that is 6. He and his wife and 54-year-old son live in a home. He smokes a occasional cigarette. No other drugs. When well he enjoys fishing and hunting.   MENTAL STATUS EXAM: White male looked his stated age, cooperative and coherent and able to give me a good history. He had normal speech pattern with no psychomotor retardation, perhaps appeared just slightly depressed. Insight and judgment were good in terms of his problems. He was well groomed and aware of his surroundings, oriented x4, knew the presidents backwards x1, remembered three of three objects at one and three minutes.   DIAGNOSES:  AXIS I: 1. Alcohol dependence.  2. 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  AXIS III:  1. Hypertension. 2. Alcoholic hepatitis.  3. History of mild splenomegaly.   AXIS IV: No job.   AXIS V: 30. Patient is addicted to alcohol severely with medical complications.   ASSESSMENT AND PLAN: Patient needs to be continued on CIWA. I think that the Celexa is probably not quite enough and will increase that to  20. I discussed with him going into an outpatient IOP and he was agreeable to that and will have Dr. Maisie Fushomas see the patient about being admitted to IOP.   Would continue CIWA as is for now.   ____________________________ Venida JarvisWilliam James Ryan II, MD wjr:cms D: 09/10/2012 12:43:19 ET T: 09/10/2012 12:56:28 ET JOB#: [id number]  cc: Venida JarvisWilliam James Ryan II, MD, <Dictator> Jules HusbandsWILLIAM J RYAN MD ELECTRONICALLY SIGNED 09/10/2012 17:22

## 2015-04-12 NOTE — Consult Note (Signed)
Chief Complaint:   Subjective/Chief Complaint Shaky with coarse tremors. Denies any GI symptoms.   VITAL SIGNS/ANCILLARY NOTES: **Vital Signs.:   19-Sep-13 09:32   Vital Signs Type Routine   Temperature Temperature (F) 98.3   Celsius 36.8   Temperature Source oral   Pulse Pulse 84   Respirations Respirations 18   Systolic BP Systolic BP 786   Diastolic BP (mmHg) Diastolic BP (mmHg) 92   Mean BP 109   BP Source  if not from Vital Sign Device non-invasive   Pulse Ox % Pulse Ox % 97   Pulse Ox Activity Level  At rest   Oxygen Delivery Room Air/ 21 %   Brief Assessment:   Additional Physical Exam No jaundice.   Lab Results: Hepatic:  19-Sep-13 05:17    Bilirubin, Total  2.2   Alkaline Phosphatase 118   SGPT (ALT)  940   SGOT (AST)  982   Total Protein, Serum 7.0   Albumin, Serum 3.5  Routine Chem:  19-Sep-13 05:17    Glucose, Serum  104   BUN 7   Creatinine (comp) 0.61   Sodium, Serum 137   Potassium, Serum 3.9   Chloride, Serum 101   CO2, Serum 26   Calcium (Total), Serum  7.8   Osmolality (calc) 272   eGFR (African American) >60   eGFR (Non-African American) >60 (eGFR values <75m/min/1.73 m2 may be an indication of chronic kidney disease (CKD). Calculated eGFR is useful in patients with stable renal function. The eGFR calculation will not be reliable in acutely ill patients when serum creatinine is changing rapidly. It is not useful in  patients on dialysis. The eGFR calculation may not be applicable to patients at the low and high extremes of body sizes, pregnant women, and vegetarians.)   Result Comment POTASSIUM/AST/MG - Slight hemolysis, interpret results with  - caution.  Result(s) reported on 11 Sep 2012 at 07:05AM.   Anion Gap 10   Magnesium, Serum 2.2 (1.8-2.4 THERAPEUTIC RANGE: 4-7 mg/dL TOXIC: > 10 mg/dL  -----------------------)  Routine Coag:  19-Sep-13 05:17    Prothrombin  16.3   INR 1.3 (INR reference interval applies to patients on  anticoagulant therapy. A single INR therapeutic range for coumarins is not optimal for all indications; however, the suggested range for most indications is 2.0 - 3.0. Exceptions to the INR Reference Range may include: Prosthetic heart valves, acute myocardial infarction, prevention of myocardial infarction, and combinations of aspirin and anticoagulant. The need for a higher or lower target INR must be assessed individually. Reference: The Pharmacology and Management of the Vitamin K  antagonists: the seventh ACCP Conference on Antithrombotic and Thrombolytic Therapy. CLJQGB.2010Sept:126 (3suppl): [id number] A HCT value >55% may artifactually increase the PT.  In one study,  the increase was an average of 25%. Reference:  "Effect on Routine and Special Coagulation Testing Values of Citrate Anticoagulant Adjustment in Patients with High HCT Values." American Journal of Clinical Pathology 2006;126:400-405.)  Routine Hem:  19-Sep-13 05:17    WBC (CBC) 5.2   RBC (CBC) 4.50   Hemoglobin (CBC) 14.1   Hematocrit (CBC) 40.8   Platelet Count (CBC)  145   MCV 91   MCH 31.4   MCHC 34.6   RDW  15.1   Neutrophil % 48.6   Lymphocyte % 41.0   Monocyte % 7.4   Eosinophil % 2.2   Basophil % 0.8   Neutrophil # 2.5   Lymphocyte # 2.1   Monocyte # 0.4  Eosinophil # 0.1   Basophil # 0.0 (Result(s) reported on 11 Sep 2012 at 07:05AM.)   Assessment/Plan:  Assessment/Plan:   Assessment Acute ETOH hepatitis with significant elevation of transaminases. LFT's are improving. Bilirubin is slightly higher and is expected to rise some more. No signs of biliary obstruction. INR stable. Signs of ETOH withdrawl.    Plan Continue supportive care. Will follow results of acute hepatitis panel. Dr. Candace Cruise will follow him in my abscence. I will be back on Monday.   Electronic Signatures: Jill Side (MD)  (Signed 19-Sep-13 13:41)  Authored: Chief Complaint, VITAL SIGNS/ANCILLARY NOTES, Brief  Assessment, Lab Results, Assessment/Plan   Last Updated: 19-Sep-13 13:41 by Jill Side (MD)
